# Patient Record
Sex: Male | Born: 1960 | Race: White | Hispanic: No | Marital: Single | Smoking: Never smoker
Health system: Southern US, Community
[De-identification: ages and names within clinical notes are randomized; demographics above are authoritative.]

## PROBLEM LIST (undated history)

## (undated) ENCOUNTER — Emergency Department (HOSPITAL_COMMUNITY): Payer: No Typology Code available for payment source | Source: Home / Self Care

## (undated) DIAGNOSIS — I10 Essential (primary) hypertension: Secondary | ICD-10-CM

---

## 2018-03-05 ENCOUNTER — Emergency Department (HOSPITAL_COMMUNITY)
Admission: EM | Admit: 2018-03-05 | Discharge: 2018-03-06 | Disposition: A | Discharge status: Home / Self Care | Payer: No Typology Code available for payment source | Attending: Emergency Medicine | Admitting: Emergency Medicine

## 2018-03-05 ENCOUNTER — Emergency Department (HOSPITAL_COMMUNITY): Payer: No Typology Code available for payment source

## 2018-03-05 ENCOUNTER — Other Ambulatory Visit: Payer: Self-pay

## 2018-03-05 ENCOUNTER — Encounter (HOSPITAL_COMMUNITY): Payer: Self-pay | Admitting: Emergency Medicine

## 2018-03-05 DIAGNOSIS — M79631 Pain in right forearm: Secondary | ICD-10-CM | POA: Insufficient documentation

## 2018-03-05 DIAGNOSIS — Y999 Unspecified external cause status: Secondary | ICD-10-CM | POA: Insufficient documentation

## 2018-03-05 DIAGNOSIS — Y92410 Unspecified street and highway as the place of occurrence of the external cause: Secondary | ICD-10-CM | POA: Diagnosis not present

## 2018-03-05 DIAGNOSIS — Y9389 Activity, other specified: Secondary | ICD-10-CM | POA: Insufficient documentation

## 2018-03-05 DIAGNOSIS — M79641 Pain in right hand: Secondary | ICD-10-CM | POA: Diagnosis present

## 2018-03-05 DIAGNOSIS — S60511A Abrasion of right hand, initial encounter: Secondary | ICD-10-CM | POA: Insufficient documentation

## 2018-03-05 DIAGNOSIS — M545 Low back pain: Secondary | ICD-10-CM | POA: Diagnosis not present

## 2018-03-05 DIAGNOSIS — I10 Essential (primary) hypertension: Secondary | ICD-10-CM | POA: Insufficient documentation

## 2018-03-05 HISTORY — DX: Essential (primary) hypertension: I10

## 2018-03-05 MED ORDER — CYCLOBENZAPRINE HCL 10 MG PO TABS: 10.0000 mg | ORAL_TABLET | Freq: Two times a day (BID) | ORAL | 0 refills | Status: AC | PRN

## 2018-03-05 MED ORDER — ACETAMINOPHEN 325 MG PO TABS: 650.0000 mg | ORAL_TABLET | Freq: Once | ORAL | Status: DC
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
## 2018-03-05 NOTE — ED Notes (Signed)
The pt is c/o rt hand and arm pain  And lower back pain  He just returend from xray

## 2018-03-05 NOTE — ED Notes (Signed)
Pt going to xray will triage when he comes back

## 2018-03-05 NOTE — ED Notes (Signed)
Pt in room; MD at bedside. Pt is crying at this time.

## 2018-03-05 NOTE — ED Notes (Signed)
Wounds cleaned with soap and peroxide

## 2018-03-06 NOTE — ED Notes (Signed)
Wounds cleaned with soap and water.  
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
